# Patient Record
Sex: Female | Born: 1935 | Race: Black or African American | Hispanic: No | Smoking: Former smoker
Health system: Southern US, Community
[De-identification: ages and names within clinical notes are randomized; demographics above are authoritative.]

## PROBLEM LIST (undated history)

## (undated) DIAGNOSIS — M792 Neuralgia and neuritis, unspecified: Secondary | ICD-10-CM

## (undated) DIAGNOSIS — M1990 Unspecified osteoarthritis, unspecified site: Secondary | ICD-10-CM

## (undated) DIAGNOSIS — E785 Hyperlipidemia, unspecified: Secondary | ICD-10-CM

## (undated) DIAGNOSIS — J45909 Unspecified asthma, uncomplicated: Secondary | ICD-10-CM

## (undated) DIAGNOSIS — E041 Nontoxic single thyroid nodule: Secondary | ICD-10-CM

## (undated) DIAGNOSIS — I10 Essential (primary) hypertension: Secondary | ICD-10-CM

## (undated) DIAGNOSIS — R000 Tachycardia, unspecified: Secondary | ICD-10-CM

## (undated) DIAGNOSIS — E119 Type 2 diabetes mellitus without complications: Secondary | ICD-10-CM

---

## 2005-08-05 ENCOUNTER — Ambulatory Visit: Payer: Self-pay | Admitting: Internal Medicine

## 2005-09-04 ENCOUNTER — Ambulatory Visit: Payer: Self-pay | Admitting: Unknown Physician Specialty

## 2006-08-11 ENCOUNTER — Ambulatory Visit: Payer: Self-pay | Admitting: Internal Medicine

## 2007-08-17 ENCOUNTER — Ambulatory Visit: Payer: Self-pay | Admitting: Internal Medicine

## 2008-08-22 ENCOUNTER — Ambulatory Visit: Payer: Self-pay | Admitting: Internal Medicine

## 2008-09-05 ENCOUNTER — Ambulatory Visit: Payer: Self-pay | Admitting: Internal Medicine

## 2008-09-13 HISTORY — PX: BREAST BIOPSY: SHX20

## 2008-09-29 ENCOUNTER — Ambulatory Visit: Payer: Self-pay | Admitting: Surgery

## 2009-05-11 IMAGING — US ULTRASOUND LEFT BREAST
1 series · 16 of 16 positions shown · non-contrast
Comparison: none

REASON FOR EXAM: left breast nodular density    US if needed
COMMENTS:

[Series 1: ultrasound left breast · 16 of 16 slices shown]
[im 1/16]
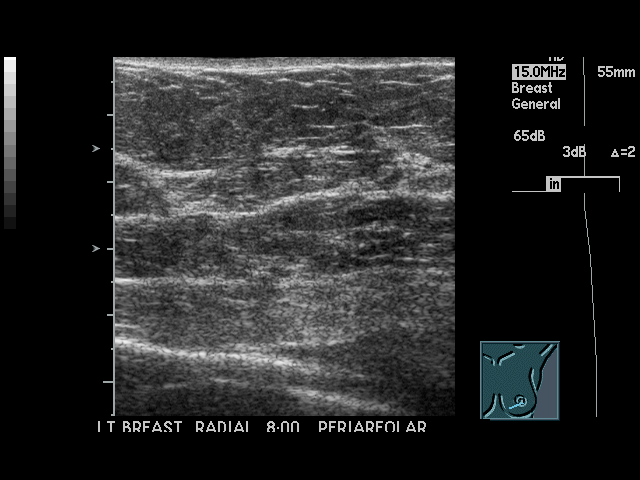
[im 2/16]
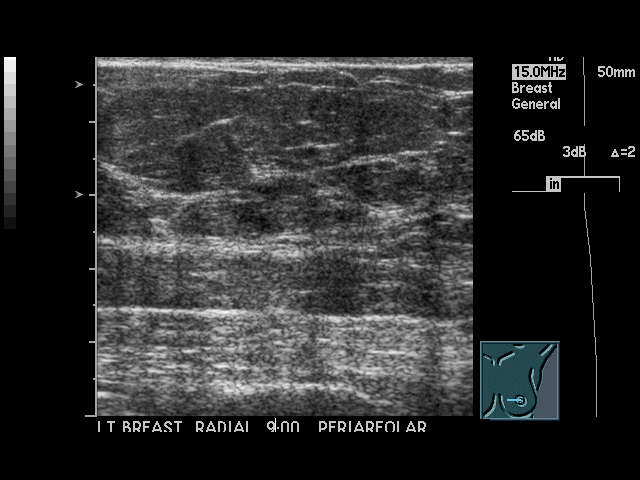
[im 3/16]
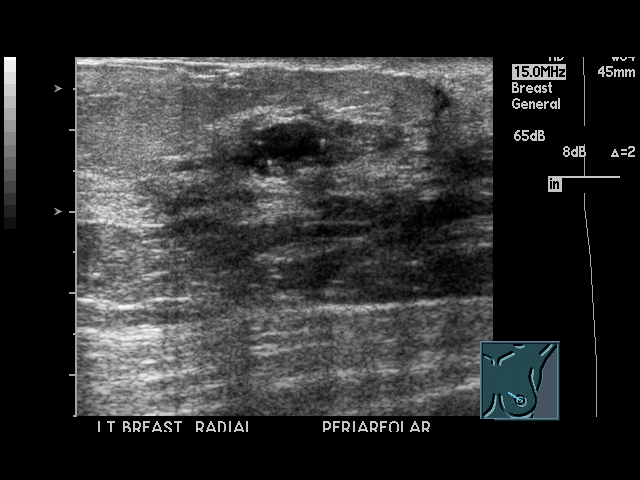
[im 4/16]
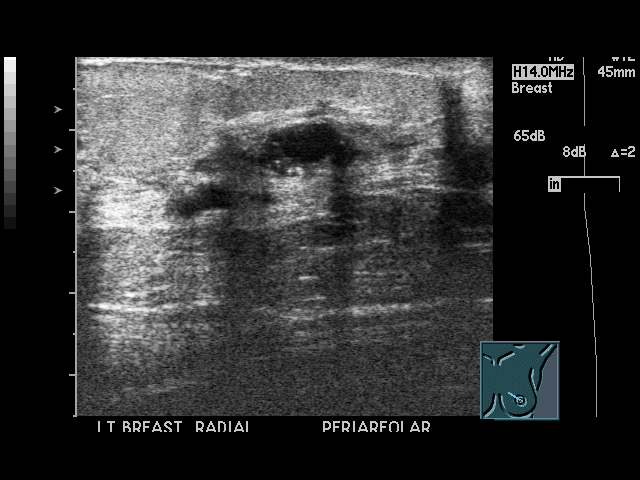
[im 5/16]
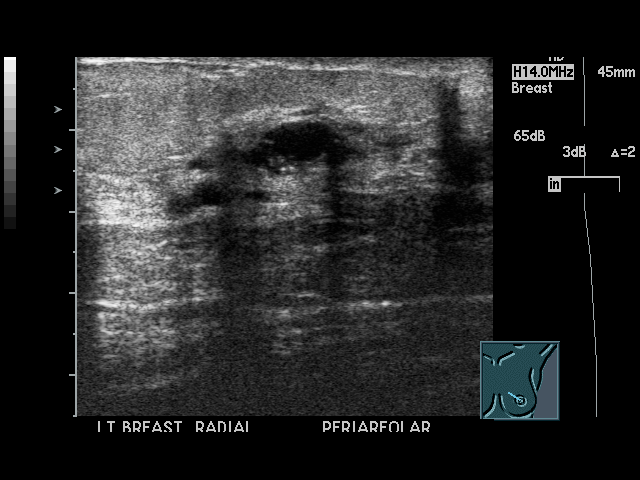
[im 6/16]
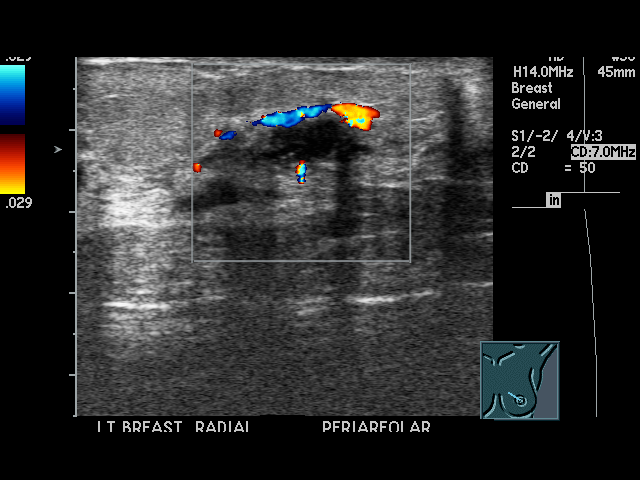
[im 7/16]
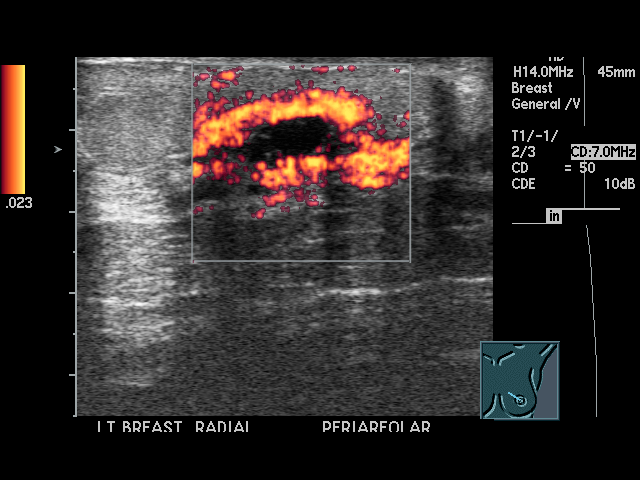
[im 8/16]
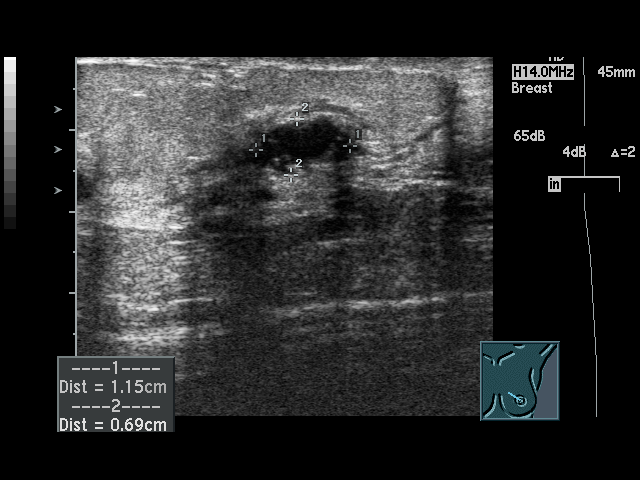
[im 9/16]
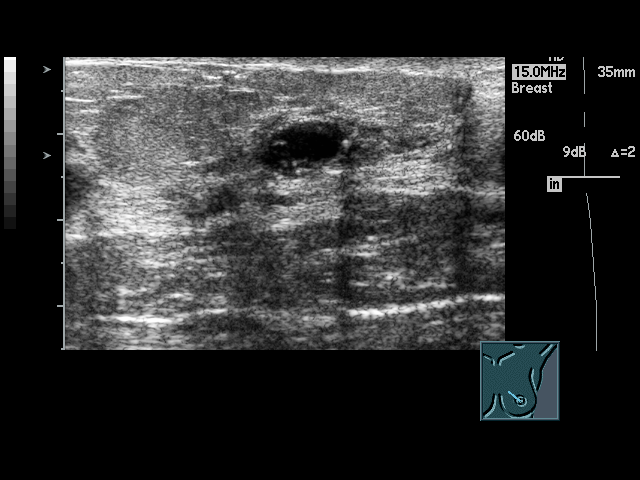
[im 10/16]
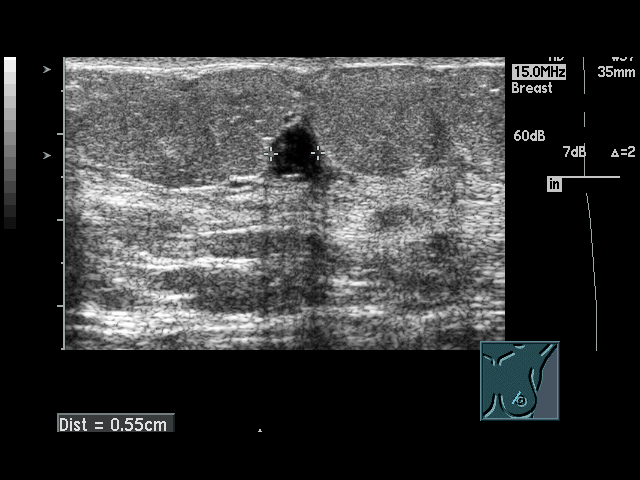
[im 11/16]
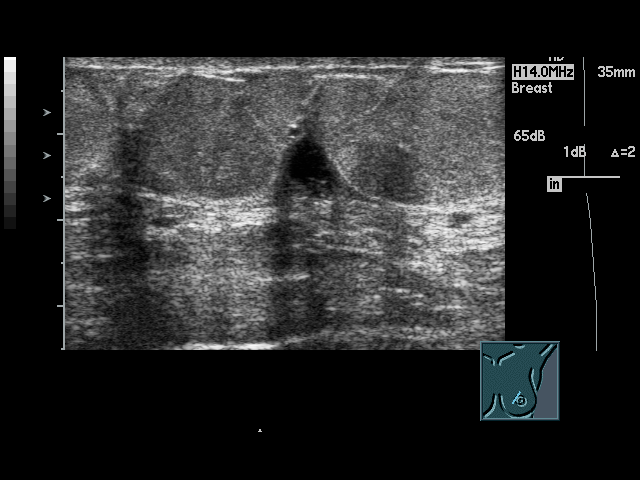
[im 12/16]
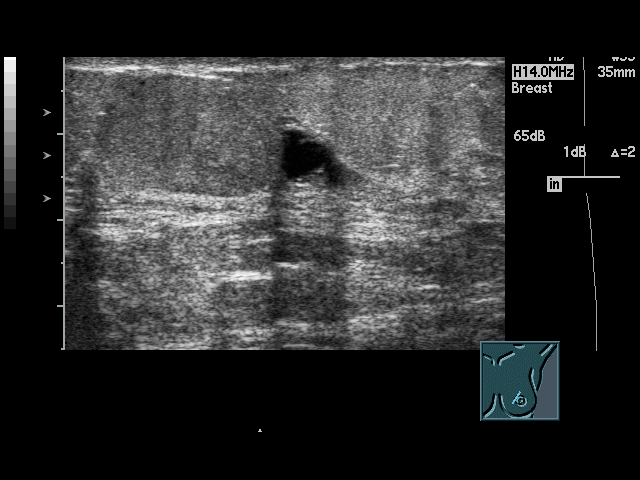
[im 13/16]
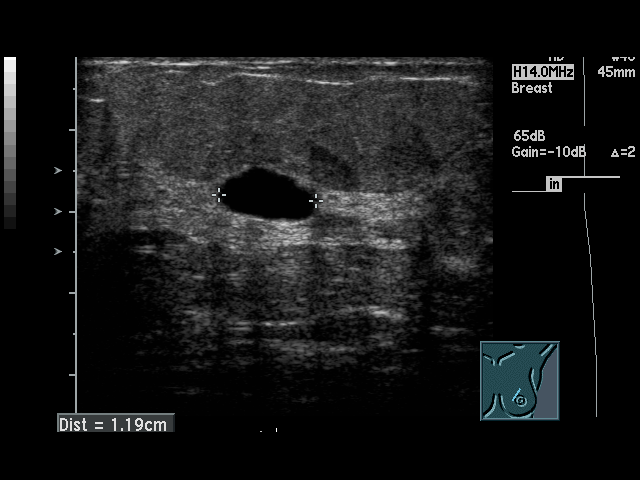
[im 14/16]
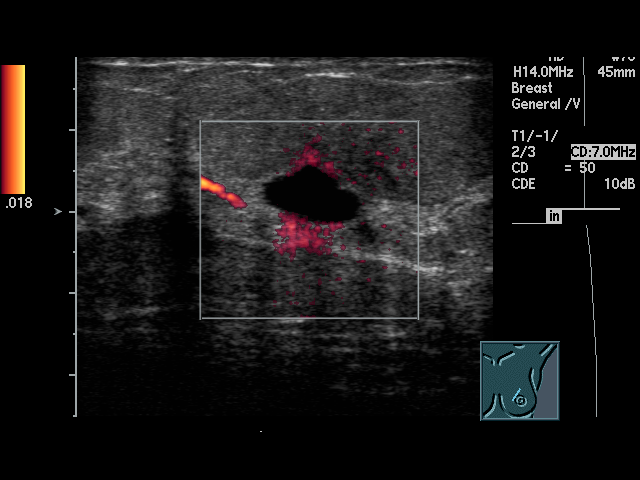
[im 15/16]
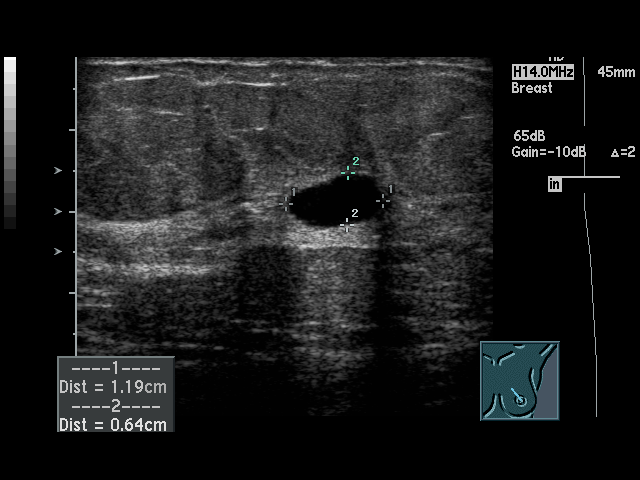
[im 16/16]
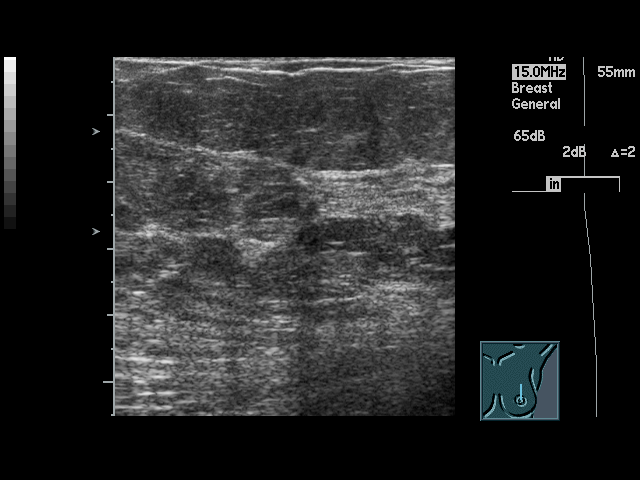

[16 of 16 positions shown; findings below may reference images not displayed]

PROCEDURE:     US  - US BREAST LEFT  - September 05, 2008 [DATE]

RESULT:       The LEFT breast was evaluated in the region of concern from
the 8 o'clock to the 12 o'clock position.  A benign-appearing cyst is
appreciated at the 11 o'clock position measuring 1.19 x 1.19 x 0.64 cm.  A
complex cystic nodule is identified at the 10 o'clock position measuring
1.15 x 0.55 x 0.69 cm.  This area demonstrates a thick irregularly-shaped
wall with areas concerning for mild flow within the wall.  This nodule has a
concerning sonographic appearance.
IMPRESSION: 1.     Benign finding at the 11 o'clock position.
2.     Suspicious finding at the 10 o'clock position. Surgical consultation
recommended.  Please refer to the additional radiographic view dictation for
complete discussion.

## 2009-09-14 ENCOUNTER — Ambulatory Visit: Payer: Self-pay | Admitting: Internal Medicine

## 2023-04-24 ENCOUNTER — Encounter: Payer: Self-pay | Admitting: Ophthalmology

## 2023-04-25 ENCOUNTER — Encounter: Payer: Self-pay | Admitting: Ophthalmology

## 2023-04-25 NOTE — Anesthesia Preprocedure Evaluation (Addendum)
Anesthesia Evaluation  Patient identified by MRN, date of birth, ID band Patient awake    Reviewed: Allergy & Precautions, NPO status , Patient's Chart, lab work & pertinent test results  History of Anesthesia Complications Negative for: history of anesthetic complications  Airway Mallampati: I   Neck ROM: Full    Dental  (+) Missing, Chipped   Pulmonary asthma , former smoker (quit 1989)   Pulmonary exam normal breath sounds clear to auscultation       Cardiovascular hypertension, Normal cardiovascular exam Rhythm:Regular Rate:Normal  Tachycardia    Neuro/Psych  Neuromuscular disease (neuropathy)    GI/Hepatic negative GI ROS,,,  Endo/Other  diabetes, Type 2    Renal/GU negative Renal ROS     Musculoskeletal  (+) Arthritis ,    Abdominal   Peds  Hematology negative hematology ROS (+)   Anesthesia Other Findings Hypertension  Tachycardia Hyperlipidemia  Type 2 diabetes mellitus (HCC) Thyroid nodule  Neuropathic pain, leg, right Osteoarthritis  Asthma    Reproductive/Obstetrics                             Anesthesia Physical Anesthesia Plan  ASA: 2  Anesthesia Plan: MAC   Post-op Pain Management:    Induction: Intravenous  PONV Risk Score and Plan: 2 and Treatment may vary due to age or medical condition, Midazolam and TIVA  Airway Management Planned: Natural Airway and Nasal Cannula  Additional Equipment:   Intra-op Plan:   Post-operative Plan:   Informed Consent: I have reviewed the patients History and Physical, chart, labs and discussed the procedure including the risks, benefits and alternatives for the proposed anesthesia with the patient or authorized representative who has indicated his/her understanding and acceptance.     Dental advisory given  Plan Discussed with: CRNA  Anesthesia Plan Comments: (LMA/GETA backup discussed.  Patient consented for risks  of anesthesia including but not limited to:  - adverse reactions to medications - damage to eyes, teeth, lips or other oral mucosa - nerve damage due to positioning  - sore throat or hoarseness - damage to heart, brain, nerves, lungs, other parts of body or loss of life  Informed patient about role of CRNA in peri- and intra-operative care.  Patient voiced understanding.)       Anesthesia Quick Evaluation

## 2023-05-02 NOTE — Discharge Instructions (Signed)

## 2023-05-05 ENCOUNTER — Encounter
Admission: RE | Disposition: A | Discharge status: Home / Self Care | Payer: Self-pay | Source: Home / Self Care | Attending: Ophthalmology

## 2023-05-05 ENCOUNTER — Ambulatory Visit: Payer: Medicare Other | Admitting: Anesthesiology

## 2023-05-05 ENCOUNTER — Encounter: Payer: Self-pay | Admitting: Ophthalmology

## 2023-05-05 ENCOUNTER — Other Ambulatory Visit: Payer: Self-pay

## 2023-05-05 ENCOUNTER — Ambulatory Visit
Admission: RE | Admit: 2023-05-05 | Discharge: 2023-05-05 | Disposition: A | Discharge status: Home / Self Care | Payer: Medicare Other | Attending: Ophthalmology | Admitting: Ophthalmology

## 2023-05-05 DIAGNOSIS — H2511 Age-related nuclear cataract, right eye: Secondary | ICD-10-CM | POA: Insufficient documentation

## 2023-05-05 DIAGNOSIS — E1136 Type 2 diabetes mellitus with diabetic cataract: Secondary | ICD-10-CM | POA: Insufficient documentation

## 2023-05-05 DIAGNOSIS — J45909 Unspecified asthma, uncomplicated: Secondary | ICD-10-CM | POA: Insufficient documentation

## 2023-05-05 DIAGNOSIS — Z09 Encounter for follow-up examination after completed treatment for conditions other than malignant neoplasm: Secondary | ICD-10-CM | POA: Diagnosis not present

## 2023-05-05 DIAGNOSIS — Z87891 Personal history of nicotine dependence: Secondary | ICD-10-CM | POA: Diagnosis not present

## 2023-05-05 DIAGNOSIS — E1142 Type 2 diabetes mellitus with diabetic polyneuropathy: Secondary | ICD-10-CM | POA: Diagnosis not present

## 2023-05-05 DIAGNOSIS — Z7984 Long term (current) use of oral hypoglycemic drugs: Secondary | ICD-10-CM | POA: Diagnosis not present

## 2023-05-05 DIAGNOSIS — I10 Essential (primary) hypertension: Secondary | ICD-10-CM | POA: Diagnosis not present

## 2023-05-05 HISTORY — DX: Type 2 diabetes mellitus without complications: E11.9

## 2023-05-05 HISTORY — DX: Tachycardia, unspecified: R00.0

## 2023-05-05 HISTORY — DX: Unspecified asthma, uncomplicated: J45.909

## 2023-05-05 HISTORY — DX: Neuralgia and neuritis, unspecified: M79.2

## 2023-05-05 HISTORY — PX: CATARACT EXTRACTION W/PHACO: SHX586

## 2023-05-05 HISTORY — DX: Essential (primary) hypertension: I10

## 2023-05-05 HISTORY — DX: Hyperlipidemia, unspecified: E78.5

## 2023-05-05 HISTORY — DX: Nontoxic single thyroid nodule: E04.1

## 2023-05-05 HISTORY — DX: Unspecified osteoarthritis, unspecified site: M19.90

## 2023-05-05 LAB — GLUCOSE, CAPILLARY: Glucose-Capillary: 145 mg/dL — ABNORMAL HIGH (ref 70–99)

## 2023-05-05 SURGERY — PHACOEMULSIFICATION, CATARACT, WITH IOL INSERTION
Anesthesia: Monitor Anesthesia Care | Laterality: Right

## 2023-05-05 MED ORDER — TETRACAINE HCL 0.5 % OP SOLN
1.0000 [drp] | OPHTHALMIC | Status: DC | PRN
  Administered 2023-05-05 (×3): 1 [drp] via OPHTHALMIC

## 2023-05-05 MED ORDER — LIDOCAINE HCL (PF) 2 % IJ SOLN
INTRAOCULAR | Status: DC | PRN
  Administered 2023-05-05: 1 mL via INTRAOCULAR

## 2023-05-05 MED ORDER — ACETAMINOPHEN 160 MG/5ML PO SOLN: 325.0000 mg | ORAL | Status: DC | PRN

## 2023-05-05 MED ORDER — SIGHTPATH DOSE#1 BSS IO SOLN
INTRAOCULAR | Status: DC | PRN
  Administered 2023-05-05: 15 mL via INTRAOCULAR

## 2023-05-05 MED ORDER — ARMC OPHTHALMIC DILATING DROPS
1.0000 | OPHTHALMIC | Status: DC | PRN
  Administered 2023-05-05 (×3): 1 via OPHTHALMIC

## 2023-05-05 MED ORDER — ACETAMINOPHEN 325 MG PO TABS: 650.0000 mg | ORAL_TABLET | Freq: Once | ORAL | Status: DC | PRN

## 2023-05-05 MED ORDER — MOXIFLOXACIN HCL 0.5 % OP SOLN
OPHTHALMIC | Status: DC | PRN
  Administered 2023-05-05: .2 mL via OPHTHALMIC

## 2023-05-05 MED ORDER — FENTANYL CITRATE (PF) 100 MCG/2ML IJ SOLN
INTRAMUSCULAR | Status: DC | PRN
  Administered 2023-05-05: 25 ug via INTRAVENOUS

## 2023-05-05 MED ORDER — SIGHTPATH DOSE#1 BSS IO SOLN
INTRAOCULAR | Status: DC | PRN
  Administered 2023-05-05: 168 mL via OPHTHALMIC

## 2023-05-05 MED ORDER — LACTATED RINGERS IV SOLN: INTRAVENOUS | Status: DC

## 2023-05-05 MED ORDER — SIGHTPATH DOSE#1 NA HYALUR & NA CHOND-NA HYALUR IO KIT
PACK | INTRAOCULAR | Status: DC | PRN
  Administered 2023-05-05: 1 via OPHTHALMIC

## 2023-05-05 MED ORDER — ONDANSETRON HCL 4 MG/2ML IJ SOLN: 4.0000 mg | Freq: Once | INTRAMUSCULAR | Status: DC | PRN

## 2023-05-05 MED ORDER — SODIUM CHLORIDE 0.9% FLUSH
INTRAVENOUS | Status: DC | PRN
  Administered 2023-05-05: 10 mL via INTRAVENOUS

## 2023-05-05 MED ORDER — MIDAZOLAM HCL 2 MG/2ML IJ SOLN
INTRAMUSCULAR | Status: DC | PRN
  Administered 2023-05-05: 1 mg via INTRAVENOUS

## 2023-05-05 SURGICAL SUPPLY — 11 items
CATARACT SUITE SIGHTPATH (MISCELLANEOUS) ×1 IMPLANT
DISSECTOR HYDRO NUCLEUS 50X22 (MISCELLANEOUS) ×1 IMPLANT
FEE CATARACT SUITE SIGHTPATH (MISCELLANEOUS) ×1 IMPLANT
GLOVE SURG GAMMEX PI TX LF 7.5 (GLOVE) ×1 IMPLANT
GLOVE SURG SYN 8.5 E (GLOVE) ×1 IMPLANT
GLOVE SURG SYN 8.5 PF PI (GLOVE) ×1 IMPLANT
LENS IOL TECNIS EYHANCE 20.0 (Intraocular Lens) IMPLANT
NDL FILTER BLUNT 18X1 1/2 (NEEDLE) ×1 IMPLANT
NEEDLE FILTER BLUNT 18X1 1/2 (NEEDLE) ×1 IMPLANT
SYR 3ML LL SCALE MARK (SYRINGE) ×1 IMPLANT
SYR 5ML LL (SYRINGE) ×1 IMPLANT

## 2023-05-05 NOTE — H&P (Signed)
[facility]   Primary Care Physician:  Luciana Axe, NP Ophthalmologist: Dr. Willey Blade  Pre-Procedure History & Physical: HPI:  Sonya Barber is a 89 y.o. female here for cataract surgery.   Past Medical History:  Diagnosis Date   Asthma    Chronic tachycardia    Hyperlipidemia    Hypertension    Neuropathic pain, leg, right    Osteoarthritis    knees   Tachycardia    followed by PCP   Thyroid nodule    Type 2 diabetes mellitus [facility])     Past Surgical History:  Procedure Laterality Date   BREAST BIOPSY  09/2008    Prior to Admission medications   Medication Sig Start Date End Date Taking? Authorizing Provider  acetaminophen (TYLENOL) 650 MG CR tablet Take 1,300 mg by mouth every 8 (eight) hours as needed for pain.   Yes [provider]  albuterol (VENTOLIN HFA) 108 (90 Base) MCG/ACT inhaler Inhale into the lungs every 6 (six) hours as needed for wheezing or shortness of breath.   Yes [provider]  B Complex-C (SUPER B COMPLEX PO) Take by mouth daily.   Yes [provider]  COD LIVER OIL W/VIT A & D PO Take by mouth daily.   Yes [provider]  Coenzyme Q10 (CO Q-10) 100 MG CAPS Take by mouth daily.   Yes [provider]  ezetimibe (ZETIA) 10 MG tablet Take 10 mg by mouth daily.   Yes [provider]  fluticasone furoate-vilanterol (BREO ELLIPTA) 100-25 MCG/ACT AEPB Inhale 1 puff into the lungs daily.   Yes [provider]  Glucos-MSM-C-Mn-Ginger-Willow (GLUCOSAMINE MSM COMPLEX PO) Take by mouth daily.   Yes [provider]  hydrochlorothiazide (HYDRODIURIL) 25 MG tablet Take 25 mg by mouth daily.   Yes [provider]  linagliptin (TRADJENTA) 5 MG TABS tablet Take 5 mg by mouth daily.   Yes [provider]  losartan (COZAAR) 100 MG tablet Take 100 mg by mouth daily.   Yes [provider]  meloxicam (MOBIC) 7.5 MG tablet Take 7.5 mg by mouth daily.   Yes  [provider]  metoprolol succinate (TOPROL-XL) 50 MG 24 hr tablet Take 50 mg by mouth daily. Take with or immediately following a meal.   Yes [provider]  tirzepatide Greggory Keen) 2.5 MG/0.5ML Pen Inject 2.5 mg into the skin once a week.   Yes [provider]    Allergies as of 04/21/2023   (Not on File)    History reviewed. No pertinent family history.  Social History   Socioeconomic History   Marital status: Married    Spouse name: Not on file   Number of children: Not on file   Years of education: Not on file   Highest education level: Not on file  Occupational History   Not on file  Tobacco Use   Smoking status: Former    Types: Cigarettes   Smokeless tobacco: Never   Tobacco comments:    Smoked socially until 2006  Vaping Use   Vaping status: Never Used  Substance and Sexual Activity   Alcohol use: Not on file    Comment: 1 tespoon Gin Daily   Drug use: Not on file   Sexual activity: Not on file  Other Topics Concern   Not on file  Social History Narrative   Not on file   Social Determinants of Health   Financial Resource Strain: Not on file  Food Insecurity:  Not on file  Transportation Needs: Not on file  Physical Activity: Not on file  Stress: Not on file  Social Connections: Not on file  Intimate Partner Violence: Not on file    Review of Systems: See HPI, otherwise negative ROS  Physical Exam: Ht 5\' 1"  (1.549 m)   Wt 95.3 kg   BMI 39.68 kg/m  General:   Alert, cooperative in NAD Head:  Normocephalic and atraumatic. Respiratory:  Normal work of breathing. Cardiovascular:  RRR  Impression/Plan: Sonya Barber is here for cataract surgery.  Risks, benefits, limitations, and alternatives regarding cataract surgery have been reviewed with the patient.  Questions have been answered.  All parties agreeable.   Willey Blade, MD  05/05/2023, 11:22 AM

## 2023-05-05 NOTE — Transfer of Care (Signed)
Immediate Anesthesia Transfer of Care Note  Patient: Sonya Barber  Procedure(s) Performed: CATARACT EXTRACTION PHACO AND INTRAOCULAR LENS PLACEMENT (IOC) RIGHT DIABETIC 6.14 01:09.4 (Right)  Patient Location: PACU  Anesthesia Type:MAC  Level of Consciousness: awake, alert , and oriented  Airway & Oxygen Therapy: Patient Spontanous Breathing  Post-op Assessment: Report given to RN  Post vital signs: Reviewed and stable  Last Vitals:  Vitals Value Taken Time  BP 129/58 05/05/23 1156  Temp 36.7 C 05/05/23 1156  Pulse 94 05/05/23 1158  Resp 17 05/05/23 1158  SpO2 99 % 05/05/23 1158  Vitals shown include unfiled device data.  Last Pain: There were no vitals filed for this visit.       Complications: No notable events documented.

## 2023-05-05 NOTE — Op Note (Signed)
OPERATIVE NOTE  Sonya Barber [id number] 05/05/2023   PREOPERATIVE DIAGNOSIS:  Nuclear sclerotic cataract right eye.  H25.11   POSTOPERATIVE DIAGNOSIS:    Nuclear sclerotic cataract right eye.     PROCEDURE:  Phacoemusification with posterior chamber intraocular lens placement of the right eye   LENS:   Implant Name Type Inv. Item Serial No. Manufacturer Lot No. LRB No. Used Action  LENS IOL TECNIS EYHANCE 20.0 - [id number] Intraocular Lens LENS IOL TECNIS EYHANCE 20.0 [id number] [employer]  Right 1 Implanted       Procedure(s): CATARACT EXTRACTION PHACO AND INTRAOCULAR LENS PLACEMENT (IOC) RIGHT DIABETIC 6.14 01:09.4 (Right)  DIB00 +20.0   ULTRASOUND TIME: 1 minutes 09 seconds.  CDE 6.14   SURGEON:  Willey Blade, MD, MPH  ANESTHESIOLOGIST: Anesthesiologist: Reed Breech, MD CRNA: Genia Del, CRNA   ANESTHESIA:  Topical with tetracaine drops augmented with 1% preservative-free intracameral lidocaine.  ESTIMATED BLOOD LOSS: less than 1 mL.   COMPLICATIONS:  None.   DESCRIPTION OF PROCEDURE:  The patient was identified in the holding room and transported to the operating room and placed in the supine position under the operating microscope.  The right eye was identified as the operative eye and it was prepped and draped in the usual sterile ophthalmic fashion.   A 1.0 millimeter clear-corneal paracentesis was made at the 10:30 position. 0.5 ml of preservative-free 1% lidocaine with epinephrine was injected into the anterior chamber.  The anterior chamber was filled with viscoelastic.  A 2.4 millimeter keratome was used to make a near-clear corneal incision at the 8:00 position.  A curvilinear capsulorrhexis was made with a cystotome and capsulorrhexis forceps.  Balanced salt solution was used to hydrodissect and hydrodelineate the nucleus.   Phacoemulsification was then used in stop and chop fashion to remove the lens nucleus and epinucleus.  The remaining cortex  was then removed using the irrigation and aspiration handpiece. Viscoelastic was then placed into the capsular bag to distend it for lens placement.  A lens was then injected into the capsular bag.  The remaining viscoelastic was aspirated.   Wounds were hydrated with balanced salt solution.  The anterior chamber was inflated to a physiologic pressure with balanced salt solution.   Intracameral vigamox 0.1 mL undiluted was injected into the eye and a drop placed onto the ocular surface.  No wound leaks were noted.  The patient was taken to the recovery room in stable condition without complications of anesthesia or surgery  Willey Blade 05/05/2023, 11:54 AM

## 2023-05-05 NOTE — Anesthesia Postprocedure Evaluation (Signed)
Anesthesia Post Note  Patient: Sonya Barber  Procedure(s) Performed: CATARACT EXTRACTION PHACO AND INTRAOCULAR LENS PLACEMENT (IOC) RIGHT DIABETIC 6.14 01:09.4 (Right)  Patient location during evaluation: PACU Anesthesia Type: MAC Level of consciousness: awake and alert, oriented and patient cooperative Pain management: pain level controlled Vital Signs Assessment: post-procedure vital signs reviewed and stable Respiratory status: spontaneous breathing, nonlabored ventilation and respiratory function stable Cardiovascular status: blood pressure returned to baseline and stable Postop Assessment: adequate PO intake Anesthetic complications: no   No notable events documented.   Last Vitals:  Vitals:   05/05/23 1200 05/05/23 1202  BP: 126/80   Pulse: 94 92  Resp: (!) 25 (!) 23  Temp:    SpO2: 98% 98%    Last Pain:  Vitals:   05/05/23 1200  PainSc: 0-No pain                 Reed Breech

## 2023-05-06 ENCOUNTER — Encounter: Payer: Self-pay | Admitting: Ophthalmology

## 2023-05-09 ENCOUNTER — Encounter: Payer: Self-pay | Admitting: Ophthalmology

## 2023-05-14 NOTE — Discharge Instructions (Signed)

## 2023-05-19 ENCOUNTER — Ambulatory Visit: Payer: Medicare Other | Admitting: Anesthesiology

## 2023-05-19 ENCOUNTER — Ambulatory Visit
Admission: RE | Admit: 2023-05-19 | Discharge: 2023-05-19 | Disposition: A | Discharge status: Home / Self Care | Payer: Medicare Other | Source: Home / Self Care | Attending: Ophthalmology | Admitting: Ophthalmology

## 2023-05-19 ENCOUNTER — Other Ambulatory Visit: Payer: Self-pay

## 2023-05-19 ENCOUNTER — Encounter: Payer: Self-pay | Admitting: Ophthalmology

## 2023-05-19 ENCOUNTER — Encounter
Admission: RE | Disposition: A | Discharge status: Home / Self Care | Payer: Self-pay | Source: Home / Self Care | Attending: Ophthalmology

## 2023-05-19 DIAGNOSIS — R000 Tachycardia, unspecified: Secondary | ICD-10-CM | POA: Insufficient documentation

## 2023-05-19 DIAGNOSIS — I10 Essential (primary) hypertension: Secondary | ICD-10-CM | POA: Insufficient documentation

## 2023-05-19 DIAGNOSIS — M1990 Unspecified osteoarthritis, unspecified site: Secondary | ICD-10-CM | POA: Diagnosis not present

## 2023-05-19 DIAGNOSIS — Z7985 Long-term (current) use of injectable non-insulin antidiabetic drugs: Secondary | ICD-10-CM | POA: Diagnosis not present

## 2023-05-19 DIAGNOSIS — Z7984 Long term (current) use of oral hypoglycemic drugs: Secondary | ICD-10-CM | POA: Diagnosis not present

## 2023-05-19 DIAGNOSIS — Z87891 Personal history of nicotine dependence: Secondary | ICD-10-CM | POA: Diagnosis not present

## 2023-05-19 DIAGNOSIS — E1136 Type 2 diabetes mellitus with diabetic cataract: Secondary | ICD-10-CM | POA: Insufficient documentation

## 2023-05-19 DIAGNOSIS — E785 Hyperlipidemia, unspecified: Secondary | ICD-10-CM | POA: Insufficient documentation

## 2023-05-19 DIAGNOSIS — J45909 Unspecified asthma, uncomplicated: Secondary | ICD-10-CM | POA: Diagnosis not present

## 2023-05-19 DIAGNOSIS — H2512 Age-related nuclear cataract, left eye: Secondary | ICD-10-CM | POA: Insufficient documentation

## 2023-05-19 HISTORY — PX: CATARACT EXTRACTION W/PHACO: SHX586

## 2023-05-19 LAB — GLUCOSE, CAPILLARY: Glucose-Capillary: 142 mg/dL — ABNORMAL HIGH (ref 70–99)

## 2023-05-19 SURGERY — PHACOEMULSIFICATION, CATARACT, WITH IOL INSERTION
Anesthesia: Monitor Anesthesia Care | Laterality: Left

## 2023-05-19 MED ORDER — TETRACAINE HCL 0.5 % OP SOLN
1.0000 [drp] | OPHTHALMIC | Status: DC | PRN
  Administered 2023-05-19 (×3): 1 [drp] via OPHTHALMIC

## 2023-05-19 MED ORDER — SODIUM CHLORIDE 0.9% FLUSH
INTRAVENOUS | Status: DC | PRN
  Administered 2023-05-19: 10 mL via INTRAVENOUS

## 2023-05-19 MED ORDER — MOXIFLOXACIN HCL 0.5 % OP SOLN
OPHTHALMIC | Status: DC | PRN
  Administered 2023-05-19: .2 mL via OPHTHALMIC

## 2023-05-19 MED ORDER — LACTATED RINGERS IV SOLN: INTRAVENOUS | Status: DC

## 2023-05-19 MED ORDER — SIGHTPATH DOSE#1 BSS IO SOLN
INTRAOCULAR | Status: DC | PRN
  Administered 2023-05-19: 103 mL via OPHTHALMIC

## 2023-05-19 MED ORDER — MIDAZOLAM HCL 2 MG/2ML IJ SOLN
INTRAMUSCULAR | Status: DC | PRN
  Administered 2023-05-19: 1 mg via INTRAVENOUS

## 2023-05-19 MED ORDER — SIGHTPATH DOSE#1 BSS IO SOLN
INTRAOCULAR | Status: DC | PRN
  Administered 2023-05-19: 15 mL via INTRAOCULAR

## 2023-05-19 MED ORDER — LIDOCAINE HCL (PF) 2 % IJ SOLN
INTRAOCULAR | Status: DC | PRN
  Administered 2023-05-19: 1 mL via INTRAOCULAR

## 2023-05-19 MED ORDER — SIGHTPATH DOSE#1 NA HYALUR & NA CHOND-NA HYALUR IO KIT
PACK | INTRAOCULAR | Status: DC | PRN
  Administered 2023-05-19: 1 via OPHTHALMIC

## 2023-05-19 MED ORDER — FENTANYL CITRATE (PF) 100 MCG/2ML IJ SOLN
INTRAMUSCULAR | Status: DC | PRN
  Administered 2023-05-19: 25 ug via INTRAVENOUS

## 2023-05-19 MED ORDER — ARMC OPHTHALMIC DILATING DROPS
1.0000 | OPHTHALMIC | Status: DC | PRN
  Administered 2023-05-19 (×3): 1 via OPHTHALMIC

## 2023-05-19 SURGICAL SUPPLY — 11 items
CATARACT SUITE SIGHTPATH (MISCELLANEOUS) ×1
DISSECTOR HYDRO NUCLEUS 50X22 (MISCELLANEOUS) ×1 IMPLANT
FEE CATARACT SUITE SIGHTPATH (MISCELLANEOUS) ×1 IMPLANT
GLOVE SURG GAMMEX PI TX LF 7.5 (GLOVE) ×1 IMPLANT
GLOVE SURG SYN 8.5 E (GLOVE) ×1
GLOVE SURG SYN 8.5 PF PI (GLOVE) ×1 IMPLANT
LENS IOL TECNIS EYHANCE 20.0 (Intraocular Lens) IMPLANT
NDL FILTER BLUNT 18X1 1/2 (NEEDLE) ×1 IMPLANT
NEEDLE FILTER BLUNT 18X1 1/2 (NEEDLE) ×1
SYR 3ML LL SCALE MARK (SYRINGE) ×1 IMPLANT
SYR 5ML LL (SYRINGE) ×1 IMPLANT

## 2023-05-19 NOTE — Anesthesia Postprocedure Evaluation (Signed)
Anesthesia Post Note  Patient: Aaliah A Sebree  Procedure(s) Performed: CATARACT EXTRACTION PHACO AND INTRAOCULAR LENS PLACEMENT (IOC) LEFT DIABETIC 9.13 1.04.1 (Left)  Patient location during evaluation: PACU Anesthesia Type: MAC Level of consciousness: awake and alert Pain management: pain level controlled Vital Signs Assessment: post-procedure vital signs reviewed and stable Respiratory status: spontaneous breathing, nonlabored ventilation, respiratory function stable and patient connected to nasal cannula oxygen Cardiovascular status: blood pressure returned to baseline and stable Postop Assessment: no apparent nausea or vomiting Anesthetic complications: no   No notable events documented.   Last Vitals:  Vitals:   05/19/23 0758 05/19/23 0803  BP: 127/77 125/71  Pulse: 90 88  Resp: 12 (!) 23  Temp: (!) 36.1 C (!) 36.1 C  SpO2: 99% 98%    Last Pain:  Vitals:   05/19/23 0803  TempSrc:   PainSc: 0-No pain                 Cleda Mccreedy Aunika Kirsten

## 2023-05-19 NOTE — Anesthesia Preprocedure Evaluation (Addendum)
Anesthesia Evaluation  Patient identified by MRN, date of birth, ID band Patient awake    Reviewed: Allergy & Precautions, NPO status , Patient's Chart, lab work & pertinent test results  History of Anesthesia Complications Negative for: history of anesthetic complications  Airway Mallampati: II   Neck ROM: Full    Dental  (+) Missing, Chipped, Poor Dentition   Pulmonary asthma , former smoker   Pulmonary exam normal breath sounds clear to auscultation       Cardiovascular hypertension, Normal cardiovascular exam  Tachycardia    Neuro/Psych  Neuromuscular disease (neuropathy)    GI/Hepatic negative GI ROS,,,  Endo/Other  diabetes, Type 2    Renal/GU negative Renal ROS     Musculoskeletal  (+) Arthritis ,    Abdominal   Peds  Hematology negative hematology ROS (+)   Anesthesia Other Findings Hypertension  Tachycardia Hyperlipidemia  Type 2 diabetes mellitus (HCC) Thyroid nodule  Neuropathic pain, leg, right Osteoarthritis  Asthma    Reproductive/Obstetrics                             Anesthesia Physical Anesthesia Plan  ASA: 3  Anesthesia Plan: MAC   Post-op Pain Management:    Induction: Intravenous  PONV Risk Score and Plan: 2 and Treatment may vary due to age or medical condition, Midazolam and TIVA  Airway Management Planned: Natural Airway and Nasal Cannula  Additional Equipment:   Intra-op Plan:   Post-operative Plan:   Informed Consent: I have reviewed the patients History and Physical, chart, labs and discussed the procedure including the risks, benefits and alternatives for the proposed anesthesia with the patient or authorized representative who has indicated his/her understanding and acceptance.     Dental advisory given  Plan Discussed with: CRNA  Anesthesia Plan Comments: (Patient and family member consented for risks of anesthesia including but not  limited to:  - adverse reactions to medications - damage to eyes, teeth, lips or other oral mucosa - nerve damage due to positioning  - sore throat or hoarseness - Damage to heart, brain, nerves, lungs, other parts of body or loss of life  They voiced understanding.)       Anesthesia Quick Evaluation

## 2023-05-19 NOTE — Op Note (Signed)
OPERATIVE NOTE  Sonya Barber [id number] 05/19/2023   PREOPERATIVE DIAGNOSIS:  Nuclear sclerotic cataract left eye.  H25.12   POSTOPERATIVE DIAGNOSIS:    Nuclear sclerotic cataract left eye.     PROCEDURE:  Phacoemusification with posterior chamber intraocular lens placement of the left eye   LENS:   Implant Name Type Inv. Item Serial No. Manufacturer Lot No. LRB No. Used Action  LENS IOL TECNIS EYHANCE 20.0 - [id number] Intraocular Lens LENS IOL TECNIS EYHANCE 20.0 [id number] [employer]  Left 1 Implanted      Procedure(s): CATARACT EXTRACTION PHACO AND INTRAOCULAR LENS PLACEMENT (IOC) LEFT DIABETIC 9.13 1.04.1 (Left)  DIB00 +20.0   ULTRASOUND TIME: 1 minutes 04 seconds.  CDE 9.13   SURGEON:  Willey Blade, MD, MPH   ANESTHESIA:  Topical with tetracaine drops augmented with 1% preservative-free intracameral lidocaine.  ESTIMATED BLOOD LOSS: <1 mL   COMPLICATIONS:  None.   DESCRIPTION OF PROCEDURE:  The patient was identified in the holding room and transported to the operating room and placed in the supine position under the operating microscope.  The left eye was identified as the operative eye and it was prepped and draped in the usual sterile ophthalmic fashion.   A 1.0 millimeter clear-corneal paracentesis was made at the 5:00 position. 0.5 ml of preservative-free 1% lidocaine with epinephrine was injected into the anterior chamber.  The anterior chamber was filled with viscoelastic.  A 2.4 millimeter keratome was used to make a near-clear corneal incision at the 2:00 position.  A curvilinear capsulorrhexis was made with a cystotome and capsulorrhexis forceps.  Balanced salt solution was used to hydrodissect and hydrodelineate the nucleus.   Phacoemulsification was then used in stop and chop fashion to remove the lens nucleus and epinucleus.  The remaining cortex was then removed using the irrigation and aspiration handpiece. Viscoelastic was then placed into the capsular  bag to distend it for lens placement.  A lens was then injected into the capsular bag.  The remaining viscoelastic was aspirated.   Wounds were hydrated with balanced salt solution.  The anterior chamber was inflated to a physiologic pressure with balanced salt solution.  Intracameral vigamox 0.1 mL undiltued was injected into the eye and a drop placed onto the ocular surface.  No wound leaks were noted.  The patient was taken to the recovery room in stable condition without complications of anesthesia or surgery  Willey Blade 05/19/2023, 7:56 AM

## 2023-05-19 NOTE — Transfer of Care (Signed)
Immediate Anesthesia Transfer of Care Note  Patient: Sonya Barber  Procedure(s) Performed: CATARACT EXTRACTION PHACO AND INTRAOCULAR LENS PLACEMENT (IOC) LEFT DIABETIC 9.13 1.04.1 (Left)  Patient Location: PACU  Anesthesia Type: MAC  Level of Consciousness: awake, alert  and patient cooperative  Airway and Oxygen Therapy: Patient Spontanous Breathing and Patient connected to supplemental oxygen  Post-op Assessment: Post-op Vital signs reviewed, Patient's Cardiovascular Status Stable, Respiratory Function Stable, Patent Airway and No signs of Nausea or vomiting  Post-op Vital Signs: Reviewed and stable  Complications: No notable events documented.

## 2023-05-19 NOTE — H&P (Signed)
[facility]   Primary Care Physician:  Luciana Axe, NP Ophthalmologist: Dr. Willey Blade  Pre-Procedure History & Physical: HPI:  Sonya Barber is a 89 y.o. female here for cataract surgery.   Past Medical History:  Diagnosis Date   Asthma    Chronic tachycardia    Hyperlipidemia    Hypertension    Neuropathic pain, leg, right    Osteoarthritis    knees   Tachycardia    followed by PCP   Thyroid nodule    Type 2 diabetes mellitus [facility])     Past Surgical History:  Procedure Laterality Date   BREAST BIOPSY  09/2008   CATARACT EXTRACTION W/PHACO Right 05/05/2023   Procedure: CATARACT EXTRACTION PHACO AND INTRAOCULAR LENS PLACEMENT (IOC) RIGHT DIABETIC 6.14 01:09.4;  Surgeon: Nevada Crane, MD;  Location: [facility];  Service: Ophthalmology;  Laterality: Right;    Prior to Admission medications   Medication Sig Start Date End Date Taking? Authorizing Provider  acetaminophen (TYLENOL) 650 MG CR tablet Take 1,300 mg by mouth every 8 (eight) hours as needed for pain.   Yes [provider]  albuterol (VENTOLIN HFA) 108 (90 Base) MCG/ACT inhaler Inhale into the lungs every 6 (six) hours as needed for wheezing or shortness of breath.   Yes [provider]  B Complex-C (SUPER B COMPLEX PO) Take by mouth daily.   Yes [provider]  COD LIVER OIL W/VIT A & D PO Take by mouth daily.   Yes [provider]  Coenzyme Q10 (CO Q-10) 100 MG CAPS Take by mouth daily.   Yes [provider]  ezetimibe (ZETIA) 10 MG tablet Take 10 mg by mouth daily.   Yes [provider]  fluticasone furoate-vilanterol (BREO ELLIPTA) 100-25 MCG/ACT AEPB Inhale 1 puff into the lungs daily.   Yes [provider]  Glucos-MSM-C-Mn-Ginger-Willow (GLUCOSAMINE MSM COMPLEX PO) Take by mouth daily.   Yes [provider]  hydrochlorothiazide (HYDRODIURIL) 25 MG tablet Take 25 mg by mouth daily.   Yes [provider]  linagliptin (TRADJENTA) 5 MG TABS tablet Take 5 mg by mouth daily.   Yes [provider]  losartan (COZAAR) 100 MG tablet Take 100 mg by mouth daily.   Yes [provider]  meloxicam (MOBIC) 7.5 MG tablet Take 7.5 mg by mouth daily.   Yes [provider]  metoprolol succinate (TOPROL-XL) 50 MG 24 hr tablet Take 50 mg by mouth daily. Take with or immediately following a meal.   Yes [provider]  tirzepatide Greggory Keen) 2.5 MG/0.5ML Pen Inject 2.5 mg into the skin once a week.   Yes [provider]    Allergies as of 04/21/2023   (Not on File)    History reviewed. No pertinent family history.  Social History   Socioeconomic History   Marital status: Widowed    Spouse name: Not on file   Number of children: Not on file   Years of education: Not on file   Highest education level: Not on file  Occupational History   Not on file  Tobacco Use   Smoking status: Former    Types: Cigarettes   Smokeless tobacco: Never   Tobacco comments:    Smoked socially until 2006  Vaping Use   Vaping status: Never Used  Substance and Sexual Activity   Alcohol use: Not on file    Comment: 1 tespoon Gin Daily   Drug use: Not on file   Sexual  activity: Not on file  Other Topics Concern   Not on file  Social History Narrative   Not on file   Social Determinants of Health   Financial Resource Strain: Not on file  Food Insecurity: Not on file  Transportation Needs: Not on file  Physical Activity: Not on file  Stress: Not on file  Social Connections: Not on file  Intimate Partner Violence: Not on file    Review of Systems: See HPI, otherwise negative ROS  Physical Exam: BP (!) 159/83   Pulse 96   Temp (!) 97.4 F (36.3 C) (Temporal)   Resp (!) 24   Ht 5\' 1"  (1.549 m)   Wt 95.4 kg   SpO2 97%   BMI 39.75 kg/m  General:   Alert, cooperative in NAD Head:  Normocephalic and atraumatic. Respiratory:  Normal work of  breathing. Cardiovascular:  RRR  Impression/Plan: Sonya Barber is here for cataract surgery.  Risks, benefits, limitations, and alternatives regarding cataract surgery have been reviewed with the patient.  Questions have been answered.  All parties agreeable.   Willey Blade, MD  05/19/2023, 7:19 AM

## 2023-05-20 ENCOUNTER — Encounter: Payer: Self-pay | Admitting: Ophthalmology
